# Patient Record
Sex: Female | Born: 2000 | Race: White | Hispanic: No | Marital: Single | Smoking: Never smoker
Health system: Southern US, Community
[De-identification: ages and names within clinical notes are randomized; demographics above are authoritative.]

## PROBLEM LIST (undated history)

## (undated) DIAGNOSIS — K259 Gastric ulcer, unspecified as acute or chronic, without hemorrhage or perforation: Secondary | ICD-10-CM

---
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
## 2022-07-01 ENCOUNTER — Ambulatory Visit (LOCAL_COMMUNITY_HEALTH_CENTER): Payer: Self-pay

## 2022-07-01 DIAGNOSIS — Z111 Encounter for screening for respiratory tuberculosis: Secondary | ICD-10-CM

## 2022-07-04 ENCOUNTER — Other Ambulatory Visit: Payer: Self-pay

## 2022-07-04 ENCOUNTER — Ambulatory Visit (LOCAL_COMMUNITY_HEALTH_CENTER): Payer: Self-pay

## 2022-07-04 DIAGNOSIS — Z111 Encounter for screening for respiratory tuberculosis: Secondary | ICD-10-CM

## 2022-07-04 LAB — TB SKIN TEST
Induration: 4 mm
TB Skin Test: NEGATIVE

## 2022-07-04 NOTE — Progress Notes (Signed)
In nurse clinic for ppd skin reading, read negative, 4 mm as pt is asymptomatic. Results confirmed with 2 RN's ( Amy W and Destiny M.). M.Antoni Stefan, LPN.
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
